# Patient Record
Sex: Female | Born: 1965 | Race: White | Hispanic: No | Marital: Married | Smoking: Never smoker
Health system: Southern US, Community
[De-identification: ages and names within clinical notes are randomized; demographics above are authoritative.]

---

## 1999-11-27 ENCOUNTER — Other Ambulatory Visit: Admission: RE | Admit: 1999-11-27 | Discharge: 1999-11-27 | Payer: Self-pay | Admitting: Obstetrics and Gynecology

## 2000-03-28 ENCOUNTER — Ambulatory Visit (HOSPITAL_COMMUNITY): Admission: RE | Admit: 2000-03-28 | Discharge: 2000-03-28 | Payer: Self-pay | Admitting: Obstetrics and Gynecology

## 2000-06-16 ENCOUNTER — Inpatient Hospital Stay (HOSPITAL_COMMUNITY): Admission: AD | Admit: 2000-06-16 | Discharge: 2000-06-18 | Payer: Self-pay | Admitting: Obstetrics and Gynecology

## 2000-07-27 ENCOUNTER — Other Ambulatory Visit: Admission: RE | Admit: 2000-07-27 | Discharge: 2000-07-27 | Payer: Self-pay | Admitting: Obstetrics and Gynecology

## 2000-08-01 ENCOUNTER — Encounter: Payer: Self-pay | Admitting: Obstetrics and Gynecology

## 2000-08-01 ENCOUNTER — Encounter: Admission: RE | Admit: 2000-08-01 | Discharge: 2000-08-01 | Payer: Self-pay | Admitting: Physical Therapy

## 2001-08-11 ENCOUNTER — Other Ambulatory Visit: Admission: RE | Admit: 2001-08-11 | Discharge: 2001-08-11 | Payer: Self-pay | Admitting: Obstetrics and Gynecology

## 2002-09-03 ENCOUNTER — Other Ambulatory Visit: Admission: RE | Admit: 2002-09-03 | Discharge: 2002-09-03 | Payer: Self-pay | Admitting: Obstetrics and Gynecology

## 2003-09-23 ENCOUNTER — Other Ambulatory Visit: Admission: RE | Admit: 2003-09-23 | Discharge: 2003-09-23 | Payer: Self-pay | Admitting: Obstetrics and Gynecology

## 2005-08-11 ENCOUNTER — Other Ambulatory Visit: Admission: RE | Admit: 2005-08-11 | Discharge: 2005-08-11 | Payer: Self-pay | Admitting: Obstetrics and Gynecology

## 2005-09-06 ENCOUNTER — Encounter: Admission: RE | Admit: 2005-09-06 | Discharge: 2005-09-06 | Payer: Self-pay | Admitting: Obstetrics and Gynecology

## 2006-04-06 ENCOUNTER — Encounter (INDEPENDENT_AMBULATORY_CARE_PROVIDER_SITE_OTHER): Payer: Self-pay | Admitting: *Deleted

## 2006-04-06 ENCOUNTER — Ambulatory Visit (HOSPITAL_COMMUNITY): Admission: RE | Admit: 2006-04-06 | Discharge: 2006-04-07 | Payer: Self-pay | Admitting: Obstetrics and Gynecology

## 2006-09-07 ENCOUNTER — Encounter: Admission: RE | Admit: 2006-09-07 | Discharge: 2006-09-07 | Payer: Self-pay | Admitting: Obstetrics and Gynecology

## 2007-09-11 ENCOUNTER — Encounter: Admission: RE | Admit: 2007-09-11 | Discharge: 2007-09-11 | Payer: Self-pay | Admitting: Obstetrics and Gynecology

## 2008-10-10 ENCOUNTER — Encounter: Admission: RE | Admit: 2008-10-10 | Discharge: 2008-10-10 | Payer: Self-pay | Admitting: Obstetrics and Gynecology

## 2009-12-10 ENCOUNTER — Encounter: Admission: RE | Admit: 2009-12-10 | Discharge: 2009-12-10 | Payer: Self-pay | Admitting: Obstetrics and Gynecology

## 2011-01-15 ENCOUNTER — Encounter
Admission: RE | Admit: 2011-01-15 | Discharge: 2011-01-15 | Payer: Self-pay | Source: Home / Self Care | Attending: Obstetrics and Gynecology | Admitting: Obstetrics and Gynecology

## 2011-05-07 NOTE — Op Note (Signed)
NAME:  Diane Duran, Diane Duran NO.:  [id number]   MEDICAL RECORD NO.:  [id number]          PATIENT TYPE:  OIB   LOCATION:  9319                          FACILITY:  [facility]   PHYSICIAN:  Valetta Fuller, M.D.  DATE OF BIRTH:  December 19, 1966   DATE OF PROCEDURE:  04/06/2006  DATE OF DISCHARGE:                                 OPERATIVE REPORT   PREOPERATIVE DIAGNOSIS:  Stress urinary incontinence.   POSTOPERATIVE DIAGNOSIS:  Stress urinary incontinence.   PROCEDURE PERFORMED:  Retropubic suburethral sling ([employer] scientific length  LYand X system).   SURGEON:  Valetta Fuller, M.D.   ANESTHESIA:  General.   INDICATIONS:  Diane Duran is a 45 year old female who has had some  progressive stress urinary incontinence.  She has no previous urologic  history.  She has had several years of progressive leakage during most  stress events.  She otherwise has completely normal bladder functioning.  She has no problems with significant urgency.  No urge  incontinence, no hesitancy and generally empties her bladder well.  A  bedside cystometrogram showed normal month bladder sensation without overt  evidence of any bladder instability.  The patient underwent counseling with  regard to options.  She is scheduled to have a laparoscopic assisted vaginal  hysterectomy by Dr. Candice Camp because of dysfunctional bleeding.  Given the  incontinence and the bothersome nature, it was felt appropriate to consider  a concurrent procedure, and she has elected to do that.   DESCRIPTION OF PROCEDURE/FINDINGS:  When we entered the operating room, Dr.  Rana Snare had completed the laparoscopic vaginal hysterectomy.  The patient  already had general anesthesia and was in a moderate lithotomy position.  A  Foley catheter was indwelling.  We utilized a weighted vaginal speculum.  We  identified the area of the mid urethra and infiltrated the mucosa.  A small  incision was then made in the mid urethra and the  vaginal dissection was  carried forth until we were able to palpate underneath the retropubic space  bilaterally.  We used one of Dr. Vance Gather laparoscopic incisions in the  suprapubic area on the left side and made a second incision on the right  side of the midline just above the pubic symphysis.  The links retropubic  needle were then passed with direct digital finger control and brought out  to both sides of the urethra.  Cystoscopy was then performed after removal  of Foley catheter.  We could see that the needle position appeared to be at  the level the bladder neck.  There was no evidence of any bladder  perforation, bleeding or any other abnormalities.  The sling was then  attached to the needle passers and the sling was brought out with that  sheath.  A right angle clamp was placed at the mid urethra to assure  adequate tension.  This was used to prevent over tightening of the sling.  The sheaths were then removed in a standard manner and the excess sling was  cut.  Dermabond was used for those incisions.  The vaginal mucosa was then  irrigated.  The  vaginal incision was closed with a 2-0 Vicryl suture.  Hemostasis was  excellent, and no vaginal packing was utilized.  A Foley catheter was  reinserted, which was left to gravity drainage.  The patient was brought to  recovery room in stable condition.  Sponge and needle counts were correct.           ______________________________  Valetta Fuller, M.D.  Electronically Signed     DSG/MEDQ  D:  04/06/2006  T:  04/07/2006  Job:  [id number]   cc:   Dineen Kid. Rana Snare, M.D.  Fax: [phone]

## 2011-05-07 NOTE — Op Note (Signed)
NAME:  Diane Duran, ODONNEL NO.:  [id number]   MEDICAL RECORD NO.:  [id number]          PATIENT TYPE:  OIB   LOCATION:  9319                          [facility]ACILITY:  [facility]   PHYSICIAN:  Dineen Kid. Rana Snare, M.D.    DATE OF BIRTH:  [birth year]   DATE OF PROCEDURE:  04/06/2006  DATE OF DISCHARGE:                                 OPERATIVE REPORT   PREOPERATIVE DIAGNOSES:  1.  Uterine fibroids, pelvic pain and dyspareunia.  2.  Stress urinary incontinence.   POSTOPERATIVE DIAGNOSES:  1.  Uterine fibroids, pelvic pain and dyspareunia.  2.  Stress urinary incontinence.  3.  Pelvic adhesive disease.   PROCEDURE:  1.  Laparoscopically assisted vaginal hysterectomy with lysis of adhesions.  2.  Mid-urethral sling per Dr. Isabel Caprice.   SURGEON:  Dineen Kid. Rana Snare, M.D.   ASSISTANT:  Zelphia Cairo, M.D.   ANESTHESIA:  General endotracheal.   INDICATION:  Ms. Diane Duran is a 45 year old G2, P2, with history of fibroids,  dyspareunia and pelvic pain, also history of urinary incontinence.  She has  a pedunculated fibroid measuring 5.8 cm on the right side; it does cause  pain with intercourse.  She also has stress urinary incontinence with a  hypermobile urethra.  Dr. Barron Alvine, urologist, is planning on mid-  urethral sling.  The patient desires definitive surgical intervention and  requests hysterectomy and plan LAVH with the combined procedure with Dr.  Isabel Caprice.  Risks and benefits of the procedure were discussed at length, which  include, but not limited to, risks of infection, bleeding, damage to bowel,  bladder, ureters and ovaries, risks associated with blood loss, risks  associated with blood transfusion, risks associated with anesthesia.  She  gives her informed consent and wishes to proceed.   FINDINGS AT TIME OF SURGERY:  An approximately 10-week-size fibroid uterus  with a large fibroid to the right portion of the uterus into the broad  ligament.  She does have adhesions from  the sigmoid colon to the anterior  abdominal wall, otherwise normal-appearing ovaries, cul-de-sac, ureters,  liver and appendix.   DESCRIPTION OF PROCEDURE:  After adequate analgesia, the patient was placed  in the dorsal lithotomy position.  She was sterilely prepped and draped.  The bladder was sterilely drained.  An Hulka tenaculum was placed in the  cervix.  A 1-cm infraumbilical skin incision was made and a Veress needle  was inserted.  The abdomen was insufflated to dullness to percussion.  A 5-  mm trocar site was made 2 fingerbreadths above the pubic symphysis on the  left side under direct visualization.  The left utero-ovarian ligament was  identified, ligated and dissected using a Gyrus grasping forceps.  This  dissection was carried out down to just below the round ligament with the  ovary, fallopian tube and round ligament, following laterally to the uterus.  Careful dissection was carried out from the omentum from the anterior  abdominal wall with good hemostasis achieved and care taken to avoid  underlying bowel.  The right fallopian tube and utero-ovarian ligament were  gently dissected from the uterus, which was  also encased around the right  large fibroid.  The round ligament was similarly dissected and ligated with  the right ovary and fallopian tube, following lateral to the pedunculated  fibroid.  The bladder was then elevated.  The ureterovesical junction was  defined and a small incision was made, creating a bladder flap.  The abdomen  was desufflated, legs repositioned and posterior colpotomy was performed.  Cervix was circumscribed with Bovie cautery.  A Ligature instrument was used  to ligate across the uterosacral ligaments bilaterally, the cardinal  ligaments bilaterally and the bladder pillars bilaterally, with dissection  carried out with the Mayo scissors.  The anterior vaginal mucosa was entered  and the anterior peritoneum was dissected off the anterior  surface of the  cervix and the uterus and the anterior peritoneum was entered sharply and  bladder was placed underneath a Deaver retractor.  The inferior portions of  the broad ligament and uterine vasculature were clamped with LigaSure  instruments bilaterally with good hemostasis achieved.  The uterus was then  delivered through the introitus.  A small bleeder was noted at the level of  the uterine artery on the left side; this was handi-clamped and ligated  using the LigaSure device.  With good hemostasis achieved, the uterosacral  ligaments were identified and ligated with figure-of-eights of 0 Monocryl  suture.  The posterior peritoneum was then closed with a pursestring  fastener of 0 Monocryl suture.  The vaginal mucosa was then closed with  figure-of-eights of 0 Monocryl suture in a vertical fashion with good  approximation and good hemostasis achieved.  Foley catheter was placed in  the bladder with good return of clear-yellow urine.  Legs were repositioned,  abdomen reinsufflated and after a copious amount of irrigation and adequate  hemostasis was ensured, small peritoneal bleeders were coagulated with  bipolar cautery and the pedicles were carefully examined and noted to be  hemostatic.  After good hemostasis was achieved, the abdomen was then  desufflated.  The infraumbilical skin incisions were closed with figure-of-  eights of 0 Monocryl suture in the fascia and a 3-0 Vicryl Rapide  subcuticular suture in the skin.  The 5-mm site as left open for Dr. Isabel Caprice  to perform his sling procedure through the port and he would close after his  procedure.  At this point, the estimated blood loss was 300 mL.  Sponge and  instrument count was normal x3.  The patient received 1 g of Rocephin  preoperatively.      Dineen Kid Rana Snare, M.D.  Electronically Signed     DCL/MEDQ  D:  04/06/2006  T:  04/07/2006  Job:  [id number]

## 2011-05-07 NOTE — Op Note (Signed)
NAME:  Diane Duran, Diane Duran NO.:  [id number]   MEDICAL RECORD NO.:  [id number]          PATIENT TYPE:  OIB   LOCATION:  9319                          FACILITY:  [facility]   PHYSICIAN:  Valetta Fuller, M.D.  DATE OF BIRTH:  08/13/1966   DATE OF PROCEDURE:  04/06/2006  DATE OF DISCHARGE:  04/07/2006                                 OPERATIVE REPORT   PREOPERATIVE DIAGNOSIS:  Stress urinary incontinence.   POSTOPERATIVE DIAGNOSIS:  Stress urinary incontinence.   PROCEDURE PERFORMED:  [employer] mid urethral sling placement.   SURGEON:  Valetta Fuller, M.D.   ANESTHESIA:  General.   INDICATIONS:  [name] is a 45 year old female.  She was sent to me  recently through the courtesy of Dr. Candice Camp because of stress  incontinence.  The patient has had chronic pelvic pain and dyspareunia and  was scheduled to have a laparoscopic-assisted hysterectomy.  Because she  complained of concurrent stress incontinence, she was seen and evaluated in  our office.  We confirmed objective stress urinary incontinence, which was  bothersome to her.  She had no other significant voiding issues.   I believe this operative report was dictated at the time of surgery but  apparently it was not transcribed.  For the reason, I am going to re-dictate  this with the best of my recollection.   The patient appeared to understand the advantages and disadvantages with  concurrent anti-incontinent surgery.  She appeared to understand the  __________  and potential complications and problems.   TECHNIQUE/FINDINGS:  When we entered the operating room, the patient had  already had successful induction of general endotracheal anesthesia and was  in moderate lithotomy position.  She had undergone, apparently uneventful  laparoscopic-assisted vaginal hysterectomy with lysis of adhesions by Dr.  Rana Snare.  A Foley catheter was indwelling in her bladder.  We utilized a  weighted vaginal speculum  and identified the area of the mid urethra.  There, the overlying mucosa was infiltrated.  A small incision was made over  the mid urethra, and the vaginal planes were established.  We continued that  dissection to we were able to palpate the endopelvic fascia retropubically.  Two small stab incisions were then made suprapubically.  The bladder was  emptied.  The needle passers were placed with direct digital finger control  just lateral to the midline.  Once both needle passage had been performed,  and the needles were at the level of the mid urethra, the Foley catheter was  removed, and cystoscopy was performed.  This showed the needles to be in  good position at the bladder neck with no evidence of bladder injury.  The  [place] sling was then attached to the needle passers and brought out of the  suprapubic incisions bilaterally.  Attention was performed, utilizing a  right angle clamp at the level of the mid urethra to be sure that the sling  was not over-tight.  The sheath was then removed in a standard manner and redundant sling was  transected.  The suprapubic incisions were closed with Dermabond.  The  small  vaginal incision was then closed with a running 2-0 Vicryl suture, and  vaginal packing was applied.  The patient was brought to the recovery room  in stable condition.           ______________________________  Valetta Fuller, M.D.     DSG/MEDQ  D:  07/01/2006  T:  07/01/2006  Job:  [id number]

## 2011-05-07 NOTE — H&P (Signed)
NAME:  Diane Duran, Diane Duran NO.:  [id number]   MEDICAL RECORD NO.:  [id number]          PATIENT TYPE:  AMB   LOCATION:  SDC                           FACILITY:  [facility]   PHYSICIAN:  Dineen Kid. Rana Snare, M.D.    DATE OF BIRTH:  1966-01-09   DATE OF ADMISSION:  04/06/2006  DATE OF DISCHARGE:                                HISTORY & PHYSICAL   HISTORY OF PRESENT ILLNESS:  Ms. Diane Duran is a 45 year old G2, P2, with  history of fibroids, dyspareunia, pelvic pain, and also history of urinary  incontinence.  She has a pedunculated fibroid measuring 5.8 cm in size, last  measured in February .  It does cause pain with intercourse. She also has  stress urinary incontinence with urethral hypomobility which has been  evaluated by Dr. Elana Alm and plans on mid urethral sling for urinary  incontinence as well.  She presents for surgical intervention as described  above and has no further childbearing desires.   PAST MEDICAL HISTORY:  Significant for anxiety currently Effexor.   PAST OB HISTORY:  She has had two vaginal deliveries.   MEDICATIONS:  Effexor 75 mg a day.   ALLERGIES:  SULFA.   PHYSICAL EXAMINATION:  VITAL SIGNS: Blood pressure 108/60.  HEART: Regular rate and rhythm.  LUNGS: Clear to auscultation bilaterally.  ABDOMEN: Distended, nontender.  PELVIC:  She has normal external genitalia, Bartholin's, Skene; the urethra  hypermobile with Valsalva.  She has minimal cystocele and rectocele.  The  uterus is anteverted, mobile, with  a large fibroid projected to the right  adnexa.   IMPRESSION:  Uterine fibroids, pelvic pain, dyspareunia, and stress urinary  incontinence.   PLAN:  Laparoscopic-assisted vaginal hysterectomy with mid urethral sling by  Dr. Elana Alm. The risks and benefits of the procedure were discussed at  length which include but are not limited to risk of infection; bleeding;  damage to bowel, bladder, ureters, ovaries; risks associated with  anesthesia; risks associated with blood loss and blood transfusion. She does  give her informed consent and wishes to proceed.      Dineen Kid Rana Snare, M.D.  Electronically Signed     DCL/MEDQ  D:  04/05/2006  T:  04/05/2006  Job:  [id number]

## 2011-05-07 NOTE — Discharge Summary (Signed)
NAME:  SIANNE, TEJADA NO.:  [id number]   MEDICAL RECORD NO.:  [id number]          PATIENT TYPE:  OIB   LOCATION:  9319                          [facility]ACILITY:  [facility]   PHYSICIAN:  Dineen Kid. Rana Snare, M.D.    DATE OF BIRTH:  [birth year]   DATE OF ADMISSION:  04/06/2006  DATE OF DISCHARGE:  04/07/2006                                 DISCHARGE SUMMARY   HISTORY OF PRESENT ILLNESS:  [name] is a 45 year old, G2, P2, with a  history of fibroids, dyspareunia, pelvic pain, and also urinary incontinence  with a pedunculated fibroid measuring 5.8 cm in size. She had pain with  intercourse. She also has been having some stress urinary incontinence and  has been evaluated by Dr. Barron Alvine. She presents for laparoscopically-  assisted vaginal hysterectomy and also mid urethral sling per Dr. Isabel Caprice.  The risks and benefits of the procedure were discussed at length and  informed consent was obtained.   HOSPITAL COURSE:  The patient underwent a laparoscopically-assisted vaginal  hysterectomy which was uncomplicated. The blood loss during the procedure  was 300 mL. The mid urethral sling by Dr. Isabel Caprice was similarly  uncomplicated. Her postoperative care was unremarkable with good return of  bowel function. By postoperative day #1 she was tolerating a regular diet,  was ambulating without difficulty. Her postoperative hemoglobin was 11.0,  her incisions were clean, dry, and intact. She had normoactive bowel sounds.  At the time of this dictation she is voiding appropriately. We will check a  bladder scan after her next void to make sure she is emptying completely and  if she is we will go ahead and discharge her home. We will follow up in the  office in one to two weeks.   DISPOSITION:  She will follow up in the office in one or two weeks. She is  to call for any difficulty with urination, fever, pain, or bleeding. She is  given a prescription for Tylox, #30.      Dineen Kid Rana Snare,  M.D.  Electronically Signed     DCL/MEDQ  D:  04/07/2006  T:  04/07/2006  Job:  [id number]

## 2013-02-15 ENCOUNTER — Other Ambulatory Visit: Payer: Self-pay

## 2013-03-22 ENCOUNTER — Ambulatory Visit
Admission: RE | Admit: 2013-03-22 | Discharge: 2013-03-22 | Disposition: A | Discharge status: Home / Self Care | Payer: Commercial Managed Care - PPO | Source: Ambulatory Visit

## 2013-03-22 DIAGNOSIS — Z1231 Encounter for screening mammogram for malignant neoplasm of breast: Secondary | ICD-10-CM

## 2014-04-22 ENCOUNTER — Emergency Department (HOSPITAL_COMMUNITY)
Admission: EM | Admit: 2014-04-22 | Discharge: 2014-04-22 | Disposition: A | Discharge status: Home / Self Care | Payer: PRIVATE HEALTH INSURANCE | Attending: Emergency Medicine | Admitting: Emergency Medicine

## 2014-04-22 ENCOUNTER — Encounter (HOSPITAL_COMMUNITY): Payer: Self-pay | Admitting: Emergency Medicine

## 2014-04-22 DIAGNOSIS — L089 Local infection of the skin and subcutaneous tissue, unspecified: Secondary | ICD-10-CM

## 2014-04-22 DIAGNOSIS — IMO0001 Reserved for inherently not codable concepts without codable children: Secondary | ICD-10-CM | POA: Insufficient documentation

## 2014-04-22 DIAGNOSIS — Z23 Encounter for immunization: Secondary | ICD-10-CM | POA: Insufficient documentation

## 2014-04-22 DIAGNOSIS — S61209A Unspecified open wound of unspecified finger without damage to nail, initial encounter: Secondary | ICD-10-CM | POA: Insufficient documentation

## 2014-04-22 DIAGNOSIS — Y9289 Other specified places as the place of occurrence of the external cause: Secondary | ICD-10-CM | POA: Insufficient documentation

## 2014-04-22 DIAGNOSIS — W5501XA Bitten by cat, initial encounter: Secondary | ICD-10-CM

## 2014-04-22 DIAGNOSIS — S61259A Open bite of unspecified finger without damage to nail, initial encounter: Secondary | ICD-10-CM

## 2014-04-22 DIAGNOSIS — Z79899 Other long term (current) drug therapy: Secondary | ICD-10-CM | POA: Insufficient documentation

## 2014-04-22 DIAGNOSIS — Y9389 Activity, other specified: Secondary | ICD-10-CM | POA: Insufficient documentation

## 2014-04-22 LAB — CBC WITH DIFFERENTIAL/PLATELET
BASOS ABS: 0 10*3/uL (ref 0.0–0.1)
Basophils Relative: 0 % (ref 0–1)
Eosinophils Absolute: 0.2 10*3/uL (ref 0.0–0.7)
Eosinophils Relative: 2 % (ref 0–5)
HEMATOCRIT: 36.5 % (ref 36.0–46.0)
Hemoglobin: 12.4 g/dL (ref 12.0–15.0)
LYMPHS PCT: 22 % (ref 12–46)
Lymphs Abs: 2 10*3/uL (ref 0.7–4.0)
MCH: 29.7 pg (ref 26.0–34.0)
MCHC: 34 g/dL (ref 30.0–36.0)
MCV: 87.5 fL (ref 78.0–100.0)
MONO ABS: 0.7 10*3/uL (ref 0.1–1.0)
Monocytes Relative: 7 % (ref 3–12)
NEUTROS ABS: 6.3 10*3/uL (ref 1.7–7.7)
NEUTROS PCT: 69 % (ref 43–77)
PLATELETS: 212 10*3/uL (ref 150–400)
RBC: 4.17 MIL/uL (ref 3.87–5.11)
RDW: 12.4 % (ref 11.5–15.5)
WBC: 9.2 10*3/uL (ref 4.0–10.5)

## 2014-04-22 MED ORDER — TETANUS-DIPHTH-ACELL PERTUSSIS 5-2.5-18.5 LF-MCG/0.5 IM SUSP
0.5000 mL | Freq: Once | INTRAMUSCULAR | Status: AC
  Administered 2014-04-22: 0.5 mL via INTRAMUSCULAR
  Filled 2014-04-22: qty 0.5

## 2014-04-22 MED ORDER — AMOXICILLIN-POT CLAVULANATE 875-125 MG PO TABS: 1.0000 | ORAL_TABLET | Freq: Two times a day (BID) | ORAL | Status: AC

## 2014-04-22 MED ORDER — SODIUM CHLORIDE 0.9 % IV SOLN
3.0000 g | Freq: Once | INTRAVENOUS | Status: AC
  Administered 2014-04-22: 3 g via INTRAVENOUS
  Filled 2014-04-22: qty 3

## 2014-04-22 NOTE — Discharge Instructions (Signed)
Please followup with the orthopedic hand specialist later today at 8:40AM as instructed. Do not eat or drink until you call his office for your appointment.  Keep your hand elevated to help reduce swelling.   Animal Bite Animal bite wounds can get infected. It is important to get proper medical treatment. Ask your doctor if you need a rabies shot. HOME CARE   Follow your doctor's instructions for taking care of your wound.  Only take medicine as told by your doctor.  Take your medicine (antibiotics) as told. Finish them even if you start to feel better.  Keep all doctor visits as told. You may need a tetanus shot if:   You cannot remember when you had your last tetanus shot.  You have never had a tetanus shot.  The injury broke your skin. If you need a tetanus shot and you choose not to have one, you may get tetanus. Sickness from tetanus can be serious. GET HELP RIGHT AWAY IF:   Your wound is warm, red, sore, or puffy (swollen).  You notice yellowish-white fluid (pus) or a bad smell coming from the wound.  You see a red line on the skin coming from the wound.  You have a fever, chills, or you feel sick.  You feel sick to your stomach (nauseous), or you throw up (vomit).  Your pain does not go away, or it gets worse.  You have trouble moving the injured part.  You have questions or concerns. MAKE SURE YOU:   Understand these instructions.  Will watch your condition.  Will get help right away if you are not doing well or get worse. Document Released: 12/06/2005 Document Revised: 02/28/2012 Document Reviewed: 07/28/2011 [facility] Patient Information 2014 [place], [place].

## 2014-04-22 NOTE — ED Notes (Signed)
Pt reports she was attempting to give her cat medications between 3pm and 5pm on 5/3 when her cat bit her.  Two puncture wounds, one dorsal and one volar aspects between MIP and PIP joints right index finger.  Entire finger swollen, unable to flex joints due to swelling.  Redness to entire finger and into palm.

## 2014-04-22 NOTE — ED Notes (Signed)
The pt was bitten by her cat 12 hours ago while she was attempting to give the cat med.  Pain and swelling to the rt index finger and thumb from the cats puncture wounds

## 2014-04-22 NOTE — ED Provider Notes (Signed)
CSN: [id number]     Arrival date & time 04/22/14  0204 History   First MD Initiated Contact with Patient 04/22/14 [phone]     Chief Complaint  Patient presents with  . Animal Bite   HPI  History provided by the patient. Patient is a 48 year old female with no significant PMH presenting with worsening pain and swelling around her right index finger after a cat bite. Patient states she was trying to give her that medicine around 6 PM yesterday evening when she was bitten on the right index finger. She immediately washed her finger and use peroxide to clean it. It seemed to be doing well with very minimal pain or symptoms and she went about normal activities through the evening. Leg into the night and early in the morning she had worsening pain and swelling of her entire finger. She reports difficulty moving the finger due to pain and swelling. She also reports noticing small amounts of pus from the puncture wound on the palmar surface. She denies having any associated fever, chills or sweats.   History reviewed. No pertinent past medical history. History reviewed. No pertinent past surgical history. No family history on file. History  Substance Use Topics  . Smoking status: Never Smoker   . Smokeless tobacco: Not on file  . Alcohol Use: Yes   OB History   Grav Para Term Preterm Abortions TAB SAB Ect Mult Living                 Review of Systems  Constitutional: Negative for fever and chills.  All other systems reviewed and are negative.     Allergies  Sulfa antibiotics  Home Medications   Prior to Admission medications   Medication Sig Start Date End Date Taking? Authorizing Provider  loratadine (CLARITIN) 10 MG tablet Take 10 mg by mouth daily.   Yes Historical Provider, MD  oxybutynin (DITROPAN) 5 MG tablet Take 10 mg by mouth every three (3) days as needed for bladder spasms.   Yes Historical Provider, MD   BP 138/81  Pulse 71  Temp(Src) 98.4 F (36.9 C) (Oral)  Resp 18   Ht 5' 6.5" (1.689 m)  Wt 139 lb (63.05 kg)  BMI 22.10 kg/m2  SpO2 100% Physical Exam  Nursing note and vitals reviewed. Constitutional: She is oriented to person, place, and time. She appears well-developed and well-nourished. No distress.  HENT:  Head: Normocephalic.  Cardiovascular: Normal rate and regular rhythm.   Pulmonary/Chest: Effort normal and breath sounds normal. No respiratory distress. She has no wheezes.  Musculoskeletal:  2 small puncture wounds to right 2nd finger consistent of cat bite.  Diffuse swelling of finger with reduced ROM due to pain and swelling.  Pt is able to extend finger.  Diffuse pain to palpation.  Normal cap refill.   Neurological: She is alert and oriented to person, place, and time.  Skin: Skin is warm and dry. No rash noted.  Psychiatric: She has a normal mood and affect. Her behavior is normal.    ED Course  Procedures   COORDINATION OF CARE:  Nursing notes reviewed. Vital signs reviewed. Initial pt interview and examination performed.   Filed Vitals:   04/22/14 0209  BP: 138/81  Pulse: 71  Temp: 98.4 F (36.9 C)  TempSrc: Oral  Resp: 18  Height: 5' 6.5" (1.689 m)  Weight: 139 lb (63.05 kg)  SpO2: 100%    3:25 AM-patient seen and evaluated. She appears well in no acute distress. Does  not appear severely ill or toxic. Patient does have diffuse sausagelike swelling of her right index finger.  she is able to extend it. No active bleeding or drainage from puncture wounds.   The patient was also seen and evaluated by tenting position. We'll give IV Unasyn and plan to consult orthopedic hand specialist. Patient offered pain medications but declined at this time.  5:00AM spoke with Dr. Mina MarbleWeingold on call for orthopedic hand surgery.  He requests adding a CBC to evaluate patient's white blood cell count. He would like the patient to be n.p.o. and to call his office at 8:40 AM later this morning and his office can make arrangements to see  her.  Treatment plan initiated: Medications  Ampicillin-Sulbactam (UNASYN) 3 g in sodium chloride 0.9 % 100 mL IVPB (3 g Intravenous New Bag/Given 04/22/14 0418)  Tdap (BOOSTRIX) injection 0.5 mL (0.5 mLs Intramuscular Given 04/22/14 0429)   Results for orders placed during the hospital encounter of 04/22/14  CBC WITH DIFFERENTIAL      Result Value Ref Range   WBC 9.2  4.0 - 10.5 K/uL   RBC 4.17  3.87 - 5.11 MIL/uL   Hemoglobin 12.4  12.0 - 15.0 g/dL   HCT 16.136.5  09.636.0 - 04.546.0 %   MCV 87.5  78.0 - 100.0 fL   MCH 29.7  26.0 - 34.0 pg   MCHC 34.0  30.0 - 36.0 g/dL   RDW 40.912.4  81.111.5 - 91.415.5 %   Platelets 212  150 - 400 K/uL   Neutrophils Relative % 69  43 - 77 %   Neutro Abs 6.3  1.7 - 7.7 K/uL   Lymphocytes Relative 22  12 - 46 %   Lymphs Abs 2.0  0.7 - 4.0 K/uL   Monocytes Relative 7  3 - 12 %   Monocytes Absolute 0.7  0.1 - 1.0 K/uL   Eosinophils Relative 2  0 - 5 %   Eosinophils Absolute 0.2  0.0 - 0.7 K/uL   Basophils Relative 0  0 - 1 %   Basophils Absolute 0.0  0.0 - 0.1 K/uL         MDM   Final diagnoses:  Infected cat bite of finger      Angus Sellereter S Deundre Thong, PA-C 04/22/14 [phone]

## 2014-04-23 NOTE — ED Provider Notes (Signed)
Medical screening examination/treatment/procedure(s) were performed by non-physician practitioner and as supervising physician I was immediately available for consultation/collaboration.   EKG Interpretation None       Asharia Lotter, MD 04/23/14 0051 

## 2017-08-18 ENCOUNTER — Other Ambulatory Visit: Payer: Self-pay | Admitting: Obstetrics and Gynecology

## 2017-08-18 DIAGNOSIS — R928 Other abnormal and inconclusive findings on diagnostic imaging of breast: Secondary | ICD-10-CM

## 2017-08-24 ENCOUNTER — Ambulatory Visit: Payer: PRIVATE HEALTH INSURANCE

## 2017-08-24 ENCOUNTER — Ambulatory Visit
Admission: RE | Admit: 2017-08-24 | Discharge: 2017-08-24 | Disposition: A | Discharge status: Home / Self Care | Payer: No Typology Code available for payment source | Source: Ambulatory Visit | Attending: Obstetrics and Gynecology | Admitting: Obstetrics and Gynecology

## 2017-08-24 DIAGNOSIS — R928 Other abnormal and inconclusive findings on diagnostic imaging of breast: Secondary | ICD-10-CM

## 2017-10-18 ENCOUNTER — Ambulatory Visit: Payer: No Typology Code available for payment source | Admitting: Psychology

## 2018-05-13 IMAGING — MG 2D DIGITAL DIAGNOSTIC UNILATERAL LEFT MAMMOGRAM WITH CAD AND ADJ
6 series · 6 of 14 positions shown · non-contrast
Comparison: Previous exam(s).

CLINICAL DATA: Patient was called back from screening mammogram for
a possible asymmetry in the left breast.

EXAM:
2D DIGITAL DIAGNOSTIC UNILATERAL LEFT MAMMOGRAM WITH CAD AND ADJUNCT
TOMO

[L CC]
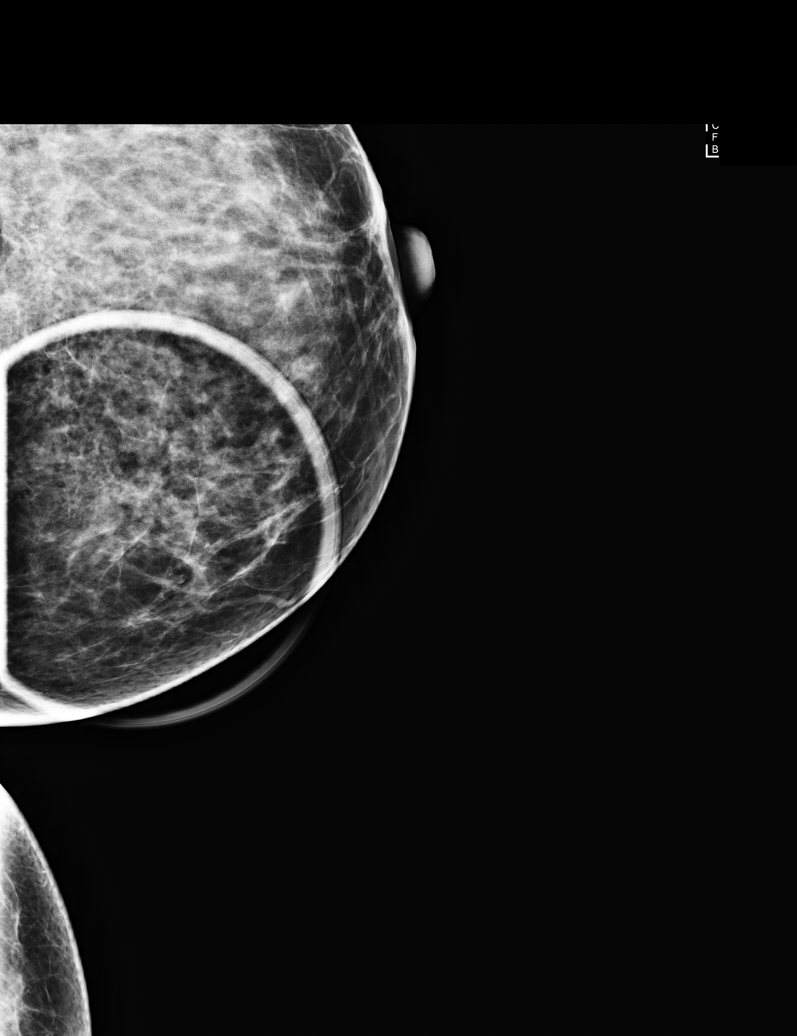

[L MLO]
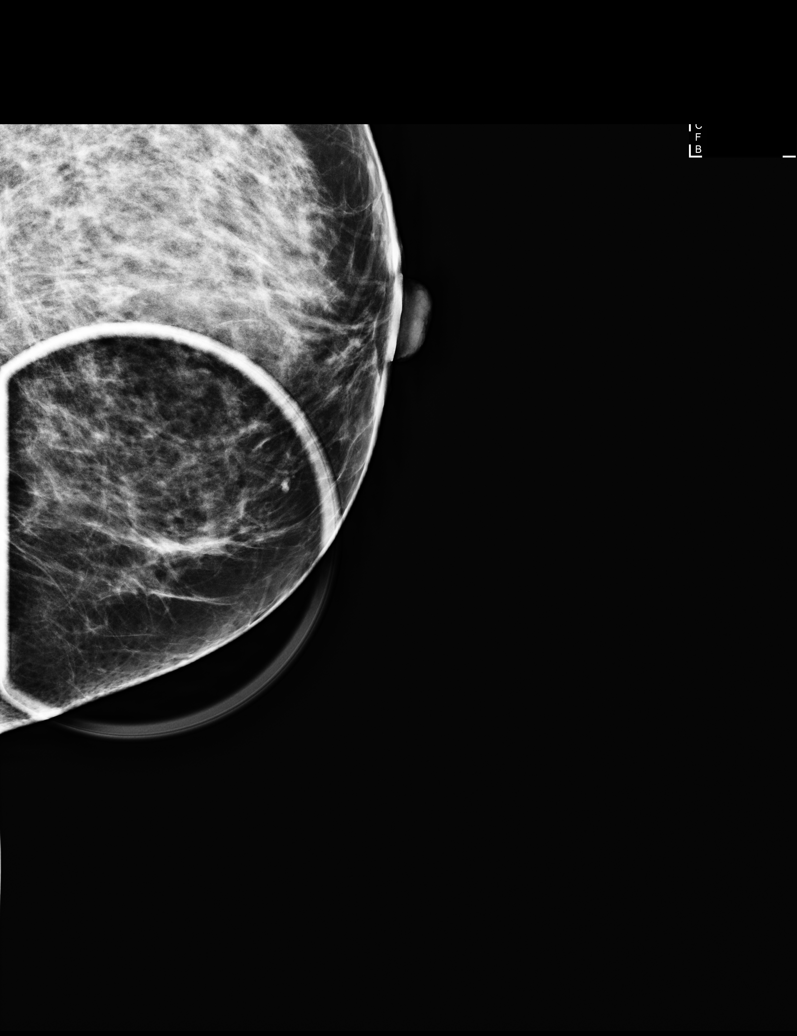

[L MLO synth-2D]
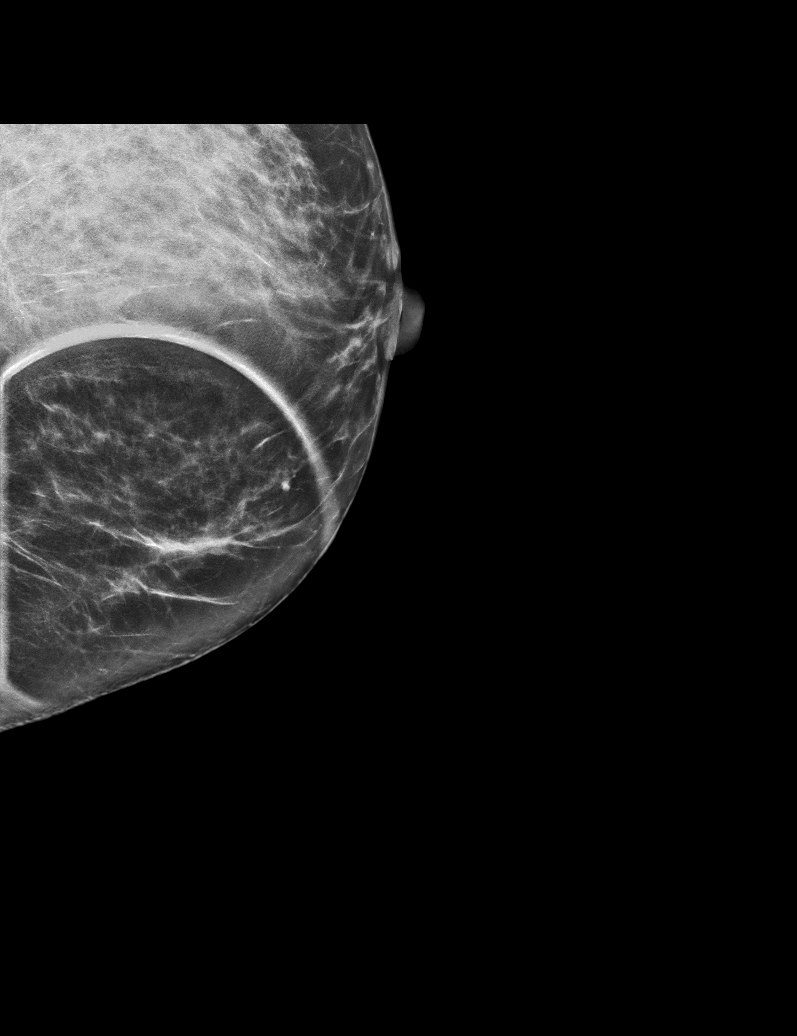

[L CC synth-2D]
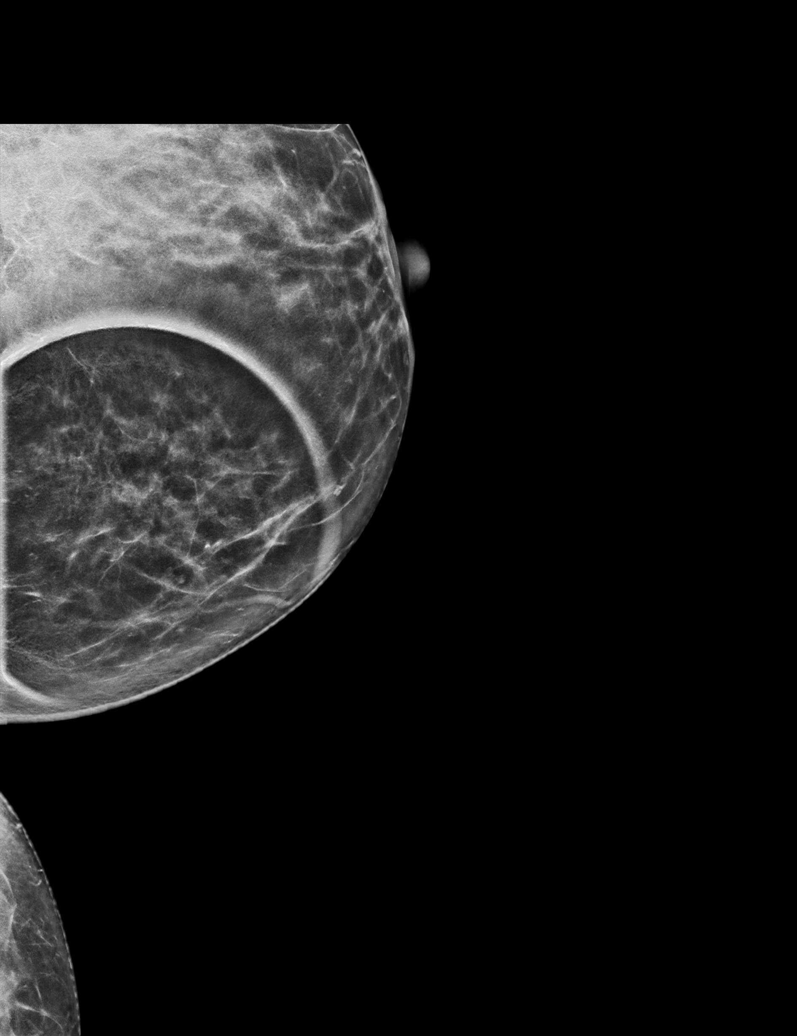

[L MLO tomo · tomo slice 33/65.0]
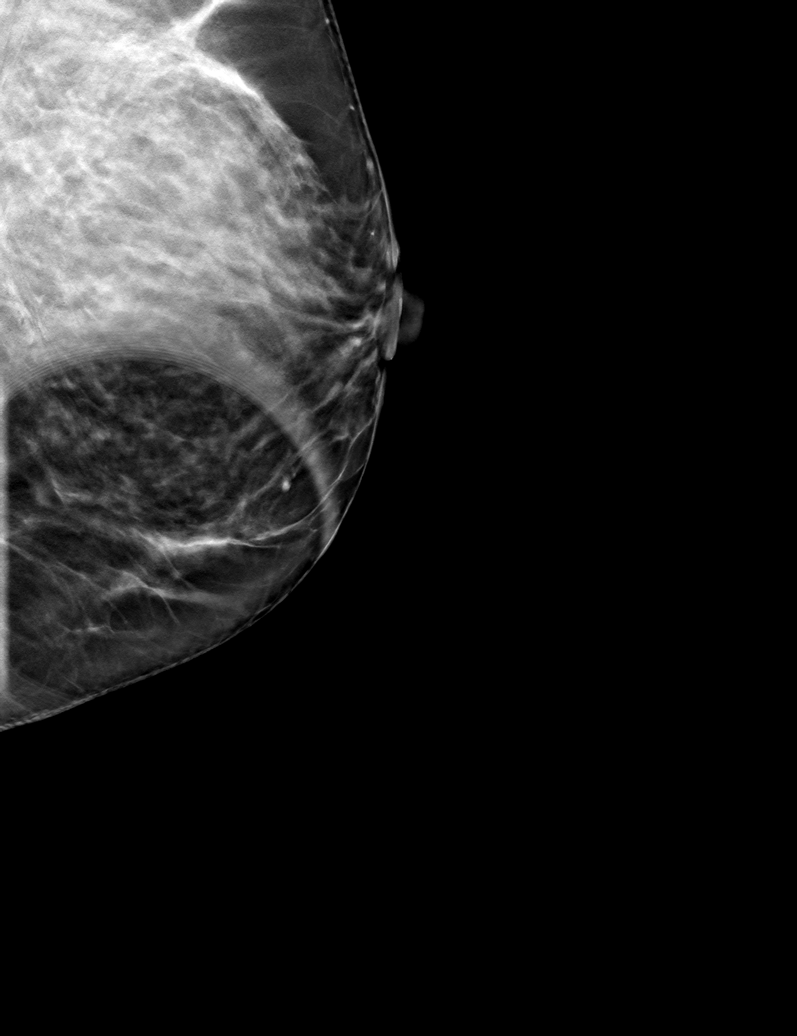

[L CC tomo · tomo slice 34/67.0]
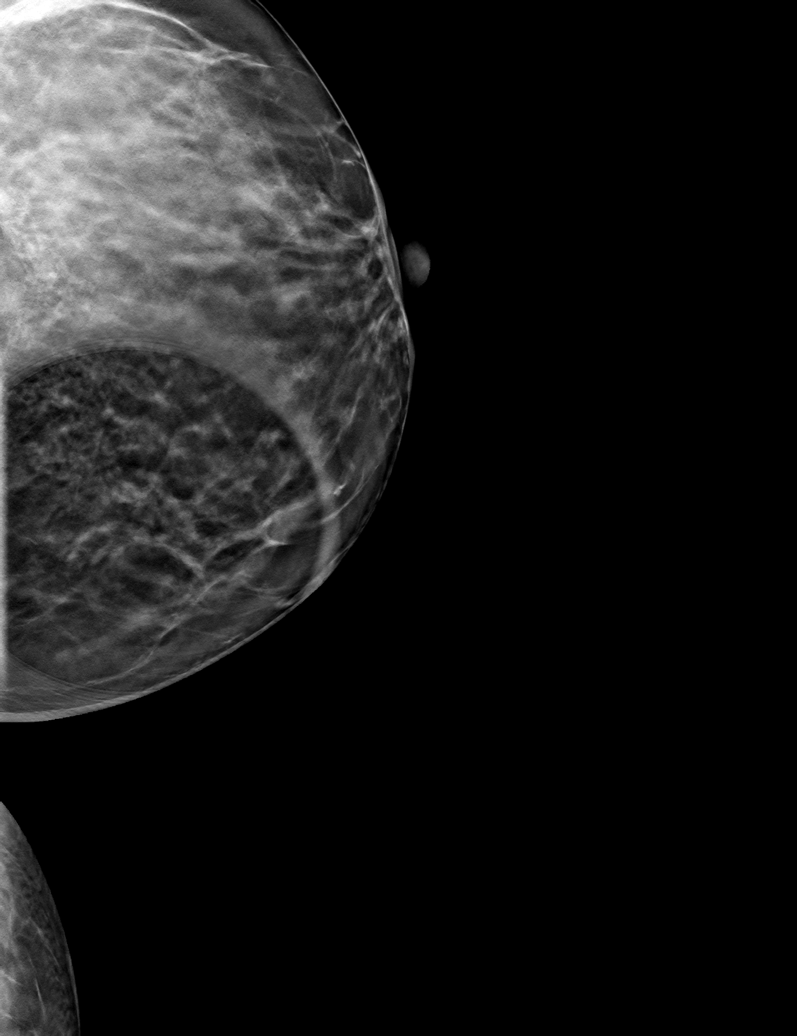

[6 of 14 positions shown; findings below may reference images not displayed]

ACR Breast Density Category c: The breast tissue is heterogeneously
dense, which may obscure small masses.
FINDINGS: Additional imaging of the left breast was performed. No persistent
mass, distortion or malignant type microcalcifications identified.

Mammographic images were processed with CAD.
IMPRESSION: No evidence of malignancy in the left breast.

RECOMMENDATION:
Bilateral screening mammogram in 1 year is recommended.

I have discussed the findings and recommendations with the patient.
Results were also provided in writing at the conclusion of the
visit. If applicable, a reminder letter will be sent to the patient
regarding the next appointment.

BI-RADS CATEGORY  1: Negative.

## 2024-05-22 ENCOUNTER — Other Ambulatory Visit: Payer: Self-pay | Admitting: Obstetrics and Gynecology

## 2024-05-22 DIAGNOSIS — N6312 Unspecified lump in the right breast, upper inner quadrant: Secondary | ICD-10-CM

## 2024-06-01 ENCOUNTER — Ambulatory Visit
Admission: RE | Admit: 2024-06-01 | Discharge: 2024-06-01 | Disposition: A | Discharge status: Home / Self Care | Source: Ambulatory Visit | Attending: Obstetrics and Gynecology | Admitting: Obstetrics and Gynecology

## 2024-06-01 DIAGNOSIS — N6312 Unspecified lump in the right breast, upper inner quadrant: Secondary | ICD-10-CM
